# Patient Record
Sex: Male | Born: 2000
Health system: Southern US, Community
[De-identification: ages and names within clinical notes are randomized; demographics above are authoritative.]

---
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
## 2022-10-10 ENCOUNTER — Emergency Department (HOSPITAL_COMMUNITY)
Admission: EM | Admit: 2022-10-10 | Discharge: 2022-10-11 | Disposition: A | Discharge status: Home / Self Care | Payer: 59 | Attending: Emergency Medicine | Admitting: Emergency Medicine

## 2022-10-10 ENCOUNTER — Other Ambulatory Visit: Payer: Self-pay

## 2022-10-10 ENCOUNTER — Encounter (HOSPITAL_COMMUNITY): Payer: Self-pay

## 2022-10-10 DIAGNOSIS — S61451A Open bite of right hand, initial encounter: Secondary | ICD-10-CM | POA: Insufficient documentation

## 2022-10-10 DIAGNOSIS — W540XXA Bitten by dog, initial encounter: Secondary | ICD-10-CM | POA: Diagnosis not present

## 2022-10-10 DIAGNOSIS — Z23 Encounter for immunization: Secondary | ICD-10-CM | POA: Diagnosis not present

## 2022-10-10 MED ORDER — AMOXICILLIN-POT CLAVULANATE 875-125 MG PO TABS: 1.0000 | ORAL_TABLET | Freq: Two times a day (BID) | ORAL | 0 refills | Status: AC

## 2022-10-10 MED ORDER — AMOXICILLIN-POT CLAVULANATE 875-125 MG PO TABS
1.0000 | ORAL_TABLET | Freq: Once | ORAL | Status: AC
  Administered 2022-10-10: 1 via ORAL
  Filled 2022-10-10: qty 1

## 2022-10-10 MED ORDER — NAPROXEN 500 MG PO TABS
500.0000 mg | ORAL_TABLET | Freq: Once | ORAL | Status: AC
  Administered 2022-10-10: 500 mg via ORAL
  Filled 2022-10-10: qty 1

## 2022-10-10 MED ORDER — TETANUS-DIPHTH-ACELL PERTUSSIS 5-2.5-18.5 LF-MCG/0.5 IM SUSY
0.5000 mL | PREFILLED_SYRINGE | Freq: Once | INTRAMUSCULAR | Status: AC
  Administered 2022-10-10: 0.5 mL via INTRAMUSCULAR
  Filled 2022-10-10: qty 0.5

## 2022-10-10 NOTE — ED Provider Notes (Signed)
[place] EMERGENCY DEPARTMENT AT [facility] Provider Note   CSN: [id number] Arrival date & time: 10/10/22  2203     History  Chief Complaint  Patient presents with   Animal Bite    Drew Thompson is a 22 y.o. male.  22 year old male presenting to the emergency department for evaluation of dog bite.  He states that his dog bit him today while he was eating food.  He just acquired the dog from the shelter.  Dog is up-to-date on all of his vaccines.  Patient reports some soreness and swelling at the site of his dog bite.  He is right-hand dominant.  No numbness or decreased range of motion of the affected extremity.  The history is provided by the patient. No language interpreter was used.  Animal Bite      Home Medications Prior to Admission medications   Medication Sig Start Date End Date Taking? Authorizing Provider  amoxicillin-clavulanate (AUGMENTIN) 875-125 MG tablet Take 1 tablet by mouth every 12 (twelve) hours. 10/10/22  Yes Antony Madura, PA-C      Allergies    Patient has no known allergies.    Review of Systems   Review of Systems Ten systems reviewed and are negative for acute change, except as noted in the HPI.    Physical Exam Updated Vital Signs BP (!) 147/90 (BP Location: Right Arm)   Pulse (!) 59   Temp 99 F (37.2 C) (Oral)   Resp 18   Ht 5\' 10"  (1.778 m)   Wt 90.7 kg   SpO2 97%   BMI 28.70 kg/m   Physical Exam Vitals and nursing note reviewed.  Constitutional:      General: He is not in acute distress.    Appearance: He is well-developed. He is not diaphoretic.     Comments: Nontoxic appearing and in NAD  HENT:     Head: Normocephalic and atraumatic.  Eyes:     General: No scleral icterus.    Conjunctiva/sclera: Conjunctivae normal.  Cardiovascular:     Rate and Rhythm: Normal rate and regular rhythm.     Pulses: Normal pulses.     Comments: Distal radial pulse 2+ in the RUE Pulmonary:     Effort: Pulmonary effort is  normal. No respiratory distress.  Musculoskeletal:        General: Normal range of motion.     Cervical back: Normal range of motion.     Comments: Puncture wound to dorsal aspect of the R hand without active bleeding. No FB or deformity noted. Mild soft tissue swelling. Preserved ROM of R hand and digits. Grip strength 5/5.  Skin:    General: Skin is warm and dry.     Coloration: Skin is not pale.     Findings: No erythema or rash.  Neurological:     Mental Status: He is alert and oriented to person, place, and time.  Psychiatric:        Behavior: Behavior normal.     ED Results / Procedures / Treatments   Labs (all labs ordered are listed, but only abnormal results are displayed) Labs Reviewed - No data to display  EKG None  Radiology No results found.  Procedures Procedures    Medications Ordered in ED Medications  Tdap (BOOSTRIX) injection 0.5 mL (has no administration in time range)  amoxicillin-clavulanate (AUGMENTIN) 875-125 MG per tablet 1 tablet (has no administration in time range)  naproxen (NAPROSYN) tablet 500 mg (has no administration in time  range)    ED Course/ Medical Decision Making/ A&P                             Medical Decision Making Risk Prescription drug management.   This patient presents to the ED for concern of dog bite, this involves an extensive number of treatment options, and is a complaint that carries with it a high risk of complications and morbidity.  The differential diagnosis includes tendon injury vs simple laceration vs fx vs cellulitis.   Co morbidities that complicate the patient evaluation  None    Cardiac Monitoring:  The patient was maintained on a cardiac monitor.  I personally viewed and interpreted the cardiac monitored which showed an underlying rhythm of: NSR   Medicines ordered and prescription drug management:  I ordered medication including Augmentin for infection prevention. Tdap updated.   Reevaluation of the patient after these medicines showed that the patient stayed the same I have reviewed the patients home medicines and have made adjustments as needed   Test Considered:  Xray R hand   Problem List / ED Course:  Puncture wound to the dorsum of the right hand associated with a dog bite.  The patient is neurovascularly intact.  No signs of secondary infection or cellulitis.  Able to fully range all digits.  Preserved grip strength.  Updated tetanus and started on course of Augmentin.  No indication for closure.  Discussed with patient that closure would increase risk of infection.  He verbalizes understanding.   Reevaluation:  After the interventions noted above, I reevaluated the patient and found that they have :stayed the same   Social Determinants of Health:  Lives independently    Dispostion:  After consideration of the diagnostic results and the patients response to treatment, I feel that the patent would benefit from outpatient course of Augmentin for infection prevention. Counseled on icing, NSAIDs. Return precautions discussed and provided. Patient discharged in stable condition with no unaddressed concerns.          Final Clinical Impression(s) / ED Diagnoses Final diagnoses:  Dog bite of right hand, initial encounter    Rx / DC Orders ED Discharge Orders          Ordered    amoxicillin-clavulanate (AUGMENTIN) 875-125 MG tablet  Every 12 hours        10/10/22 2348              Antony Madura, PA-C 10/10/22 2356    Cathren Laine, MD 10/11/22 [phone]

## 2022-10-10 NOTE — ED Notes (Signed)
Wound care provided, wound dressed with petroleum gauze and coband.

## 2022-10-10 NOTE — ED Triage Notes (Signed)
Pt. Arrives POV for a dog bite. Pt. States that he just got a german sheperd huskie mix from the shelter. The dog became aggressive while he was eating his food and attacked him. Pt. States that the dog did have all of his vaccines, but he feels like the bite was deep.

## 2022-10-10 NOTE — Discharge Instructions (Addendum)
Apply ice to areas of injury to prevent inflammation. Take Augmentin as prescribed until finished. Use Ibuprofen or Aleve for pain. Return for new or concerning symptoms.
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
## 2023-12-12 ENCOUNTER — Other Ambulatory Visit: Payer: Self-pay

## 2023-12-12 ENCOUNTER — Emergency Department (HOSPITAL_COMMUNITY)
Admission: EM | Admit: 2023-12-12 | Discharge: 2023-12-12 | Disposition: A | Discharge status: Home / Self Care | Attending: Emergency Medicine | Admitting: Emergency Medicine

## 2023-12-12 ENCOUNTER — Encounter (HOSPITAL_COMMUNITY): Payer: Self-pay

## 2023-12-12 ENCOUNTER — Emergency Department (HOSPITAL_COMMUNITY)

## 2023-12-12 DIAGNOSIS — D72829 Elevated white blood cell count, unspecified: Secondary | ICD-10-CM | POA: Diagnosis not present

## 2023-12-12 DIAGNOSIS — N451 Epididymitis: Secondary | ICD-10-CM | POA: Diagnosis not present

## 2023-12-12 DIAGNOSIS — N50812 Left testicular pain: Secondary | ICD-10-CM | POA: Diagnosis present

## 2023-12-12 LAB — CBC WITH DIFFERENTIAL/PLATELET
Abs Immature Granulocytes: 0.06 K/uL (ref 0.00–0.07)
Basophils Absolute: 0 K/uL (ref 0.0–0.1)
Basophils Relative: 0 %
Eosinophils Absolute: 0.1 K/uL (ref 0.0–0.5)
Eosinophils Relative: 1 %
HCT: 37.8 % — ABNORMAL LOW (ref 39.0–52.0)
Hemoglobin: 12.2 g/dL — ABNORMAL LOW (ref 13.0–17.0)
Immature Granulocytes: 0 %
Lymphocytes Relative: 21 %
Lymphs Abs: 3.2 K/uL (ref 0.7–4.0)
MCH: 27.4 pg (ref 26.0–34.0)
MCHC: 32.3 g/dL (ref 30.0–36.0)
MCV: 84.9 fL (ref 80.0–100.0)
Monocytes Absolute: 1 K/uL (ref 0.1–1.0)
Monocytes Relative: 6 %
Neutro Abs: 11.1 K/uL — ABNORMAL HIGH (ref 1.7–7.7)
Neutrophils Relative %: 72 %
Platelets: 251 K/uL (ref 150–400)
RBC: 4.45 MIL/uL (ref 4.22–5.81)
RDW: 12.3 % (ref 11.5–15.5)
WBC: 15.4 K/uL — ABNORMAL HIGH (ref 4.0–10.5)
nRBC: 0 % (ref 0.0–0.2)

## 2023-12-12 LAB — COMPREHENSIVE METABOLIC PANEL WITH GFR
ALT: 7 U/L (ref 0–44)
AST: 13 U/L — ABNORMAL LOW (ref 15–41)
Albumin: 4.6 g/dL (ref 3.5–5.0)
Alkaline Phosphatase: 88 U/L (ref 38–126)
Anion gap: 13 (ref 5–15)
BUN: 10 mg/dL (ref 6–20)
CO2: 20 mmol/L — ABNORMAL LOW (ref 22–32)
Calcium: 9.2 mg/dL (ref 8.9–10.3)
Chloride: 103 mmol/L (ref 98–111)
Creatinine, Ser: 0.68 mg/dL (ref 0.61–1.24)
GFR, Estimated: >60 mL/min (ref >60)
Glucose, Bld: 93 mg/dL (ref 70–99)
Potassium: 4 mmol/L (ref 3.5–5.1)
Sodium: 135 mmol/L (ref 135–145)
Total Bilirubin: 0.4 mg/dL (ref 0.0–1.2)
Total Protein: 7.4 g/dL (ref 6.5–8.1)

## 2023-12-12 LAB — URINALYSIS, ROUTINE W REFLEX MICROSCOPIC
Bilirubin Urine: NEGATIVE
Glucose, UA: NEGATIVE mg/dL
Hgb urine dipstick: NEGATIVE
Ketones, ur: NEGATIVE mg/dL
Leukocytes,Ua: NEGATIVE
Nitrite: NEGATIVE
Protein, ur: NEGATIVE mg/dL
Specific Gravity, Urine: 1.014 (ref 1.005–1.030)
pH: 5 (ref 5.0–8.0)

## 2023-12-12 MED ORDER — IBUPROFEN 800 MG PO TABS
800.0000 mg | ORAL_TABLET | Freq: Once | ORAL | Status: AC
  Administered 2023-12-12: 800 mg via ORAL
  Filled 2023-12-12: qty 1

## 2023-12-12 MED ORDER — DOXYCYCLINE HYCLATE 100 MG PO TABS
100.0000 mg | ORAL_TABLET | Freq: Once | ORAL | Status: AC
  Administered 2023-12-12: 100 mg via ORAL
  Filled 2023-12-12: qty 1

## 2023-12-12 MED ORDER — CEFTRIAXONE SODIUM 1 G IJ SOLR
500.0000 mg | Freq: Once | INTRAMUSCULAR | Status: AC
  Administered 2023-12-12: 500 mg via INTRAMUSCULAR
  Filled 2023-12-12: qty 10

## 2023-12-12 MED ORDER — STERILE WATER FOR INJECTION IJ SOLN
INTRAMUSCULAR | Status: AC
  Filled 2023-12-12: qty 10

## 2023-12-12 MED ORDER — DOXYCYCLINE HYCLATE 100 MG PO CAPS
100.0000 mg | ORAL_CAPSULE | Freq: Two times a day (BID) | ORAL | 0 refills | Status: AC
Start: 2023-12-12 — End: 2023-12-22

## 2023-12-12 NOTE — ED Triage Notes (Signed)
 Pt. Arrives pov for pain in the left testicle x1 week. States that he experienced nausea when the pain was real bad, but denies vomiting. Denies penile discharge. Reports that his testicle feels swollen.

## 2023-12-12 NOTE — ED Provider Notes (Signed)
 [facility] AT [facility] Provider Note   CSN: [id number] Arrival date & time: 12/12/23  8084     Patient presents with: Testicle Pain   Drew Thompson is a 23 y.o. male.   Pt is a 23 yo male with no significant pmhx.  Pt said he woke up about 1 week ago with left sided testicular pain.  He denies any trauma.  He denies any penile lesions or d/c.         Prior to Admission medications   Medication Sig Start Date End Date Taking? Authorizing Provider  doxycycline  (VIBRAMYCIN ) 100 MG capsule Take 1 capsule (100 mg total) by mouth 2 (two) times daily for 10 days. 12/12/23 12/22/23 Yes Dean Clarity, MD  amoxicillin -clavulanate (AUGMENTIN ) 875-125 MG tablet Take 1 tablet by mouth every 12 (twelve) hours. 10/10/22   Keith Sor, PA-C    Allergies: Patient has no known allergies.    Review of Systems  Genitourinary:  Positive for testicular pain.  All other systems reviewed and are negative.   Updated Vital Signs BP 135/79 (BP Location: Left Arm)   Pulse 63   Temp 98.8 F (37.1 C) (Oral)   Resp 20   SpO2 99%   Physical Exam Vitals and nursing note reviewed. Exam conducted with a chaperone present.  Constitutional:      Appearance: Normal appearance.  HENT:     Head: Normocephalic and atraumatic.     Right Ear: External ear normal.     Left Ear: External ear normal.     Nose: Nose normal.     Mouth/Throat:     Mouth: Mucous membranes are moist.     Pharynx: Oropharynx is clear.  Eyes:     Extraocular Movements: Extraocular movements intact.     Conjunctiva/sclera: Conjunctivae normal.     Pupils: Pupils are equal, round, and reactive to light.  Cardiovascular:     Rate and Rhythm: Normal rate and regular rhythm.     Pulses: Normal pulses.     Heart sounds: Normal heart sounds.  Pulmonary:     Effort: Pulmonary effort is normal.     Breath sounds: Normal breath sounds.  Abdominal:     General: Abdomen is flat. Bowel sounds are  normal.     Palpations: Abdomen is soft.  Genitourinary:    Testes:        Left: Tenderness present.     Comments: Mildly swollen Musculoskeletal:        General: Normal range of motion.     Cervical back: Normal range of motion and neck supple.  Skin:    General: Skin is warm.     Capillary Refill: Capillary refill takes less than 2 seconds.  Neurological:     General: No focal deficit present.     Mental Status: He is alert and oriented to person, place, and time.  Psychiatric:        Mood and Affect: Mood normal.        Behavior: Behavior normal.     (all labs ordered are listed, but only abnormal results are displayed) Labs Reviewed  CBC WITH DIFFERENTIAL/PLATELET - Abnormal; Notable for the following components:      Result Value   WBC 15.4 (*)    Hemoglobin 12.2 (*)    HCT 37.8 (*)    Neutro Abs 11.1 (*)    All other components within normal limits  COMPREHENSIVE METABOLIC PANEL WITH GFR - Abnormal; Notable for the following  components:   CO2 20 (*)    AST 13 (*)    All other components within normal limits  URINALYSIS, ROUTINE W REFLEX MICROSCOPIC - Abnormal; Notable for the following components:   Color, Urine STRAW (*)    All other components within normal limits  HIV ANTIBODY (ROUTINE TESTING W REFLEX)  GC/CHLAMYDIA PROBE AMP ([place]) NOT AT [facility]    EKG: None  Radiology: US  SCROTUM W/DOPPLER Result Date: 12/12/2023 EXAM: ULTRASOUND SCROTUM/TESTICLES WITH DOPPLER FLOW EVALUATION 12/12/2023 08:31:44 PM TECHNIQUE: Duplex ultrasound using B-mode/gray scaled imaging, Doppler spectral analysis and color flow Doppler was obtained of the testicles. COMPARISON: None available. CLINICAL HISTORY: Testicular pain. FINDINGS: RIGHT: MEASUREMENTS: The right testicle measures 3.3 x 2.4 x 2.2 cm. GREY SCALE: The right testicle demonstrates normal homogeneous echotexture without focal lesion. No testicular microlithiasis. DOPPLER EVALUATION: There is normal arterial and  venous Doppler flow within the testicle. VARICOCELES: No scrotal varicoceles. SCROTAL SAC: No hydrocele. EPIDIDYMIS: Benign epididymal cysts are seen within the right epididymis measuring up to 3 mm. No acute abnormality. LEFT: MEASUREMENTS: The left testicle measures 3.2 x 2.3 x 2.4 cm. GREY SCALE: The left testicle demonstrates normal homogeneous echotexture without focal lesion. No testicular microlithiasis. DOPPLER EVALUATION: There is normal arterial and venous Doppler flow within the testicle. VARICOCELES: No scrotal varicoceles. SCROTAL SAC: No hydrocele. EPIDIDYMIS: Benign epididymal cysts are seen within the left epididymis measuring up to 5 mm. The left epididymis demonstrates diffuse hypervascularity and is relatively enlarged in keeping with changes of acute epididymitis. IMPRESSION: 1. Left epididymitis. Electronically signed by: Dorethia Molt MD 12/12/2023 08:58 PM EDT RP Workstation: [id number]     Procedures   Medications Ordered in the ED  cefTRIAXone  (ROCEPHIN ) injection 500 mg (has no administration in time range)  doxycycline  (VIBRA -TABS) tablet 100 mg (has no administration in time range)  ibuprofen  (ADVIL ) tablet 800 mg (800 mg Oral Given 12/12/23 2048)                                    Medical Decision Making Amount and/or Complexity of Data Reviewed Labs: ordered.  Risk Prescription drug management.   This patient presents to the ED for concern of testicular pain, this involves an extensive number of treatment options, and is a complaint that carries with it a high risk of complications and morbidity.  The differential diagnosis includes torsion, sti, epididymitis, oorchitis   Co morbidities that complicate the patient evaluation  none   Additional history obtained:  Additional history obtained from epic chart review  Lab Tests:  I Ordered, and personally interpreted labs.  The pertinent results include:  cbc with wbc elevated at 15.4, hgb sl low at 12.2;  cmp nl; urine neg   Imaging Studies ordered:  I ordered imaging studies including US  testicle  I independently visualized and interpreted imaging which showed Left epididymitis.  I agree with the radiologist interpretation   Medicines ordered and prescription drug management:  I ordered medication including ibuprofen   for sx  Reevaluation of the patient after these medicines showed that the patient improved I have reviewed the patients home medicines and have made adjustments as needed   Test Considered:  us    Critical Interventions:  us    Problem List / ED Course:  Left epididymitis: pt given rocephin /doxy in ED and he's d/c with doxy.  Pt knows to return if worse.  He denies any current sexual partners.  He is to f/u with urology.     Reevaluation:  After the interventions noted above, I reevaluated the patient and found that they have :improved   Social Determinants of Health:  Lives at home   Dispostion:  After consideration of the diagnostic results and the patients response to treatment, I feel that the patent would benefit from discharge with outpatient f/u.       Final diagnoses:  Epididymitis    ED Discharge Orders          Ordered    doxycycline  (VIBRAMYCIN ) 100 MG capsule  2 times daily        12/12/23 2114               Dean Clarity, MD 12/12/23 2116

## 2023-12-13 LAB — GC/CHLAMYDIA PROBE AMP (~~LOC~~) NOT AT ARMC
Chlamydia: NEGATIVE
Comment: NEGATIVE
Comment: NORMAL
Neisseria Gonorrhea: NEGATIVE

## 2023-12-13 LAB — HIV ANTIBODY (ROUTINE TESTING W REFLEX): HIV Screen 4th Generation wRfx: NONREACTIVE
